# Patient Record
Sex: Male | Born: 1990 | Race: Black or African American | Hispanic: No | Marital: Single | Smoking: Current every day smoker
Health system: Southern US, Community
[De-identification: ages and names within clinical notes are randomized; demographics above are authoritative.]

## PROBLEM LIST (undated history)

## (undated) DIAGNOSIS — I10 Essential (primary) hypertension: Secondary | ICD-10-CM

---

## 2013-09-17 ENCOUNTER — Emergency Department (HOSPITAL_COMMUNITY)
Admission: EM | Admit: 2013-09-17 | Discharge: 2013-09-17 | Disposition: A | Discharge status: Home / Self Care | Payer: Self-pay | Attending: Emergency Medicine | Admitting: Emergency Medicine

## 2013-09-17 ENCOUNTER — Encounter (HOSPITAL_COMMUNITY): Payer: Self-pay | Admitting: Emergency Medicine

## 2013-09-17 DIAGNOSIS — L03119 Cellulitis of unspecified part of limb: Secondary | ICD-10-CM

## 2013-09-17 DIAGNOSIS — B353 Tinea pedis: Secondary | ICD-10-CM | POA: Insufficient documentation

## 2013-09-17 DIAGNOSIS — L02619 Cutaneous abscess of unspecified foot: Secondary | ICD-10-CM | POA: Insufficient documentation

## 2013-09-17 DIAGNOSIS — L03116 Cellulitis of left lower limb: Secondary | ICD-10-CM

## 2013-09-17 DIAGNOSIS — F17200 Nicotine dependence, unspecified, uncomplicated: Secondary | ICD-10-CM | POA: Insufficient documentation

## 2013-09-17 MED ORDER — CEPHALEXIN 500 MG PO CAPS
1000.0000 mg | ORAL_CAPSULE | Freq: Once | ORAL | Status: AC
  Administered 2013-09-17: 1000 mg via ORAL
  Filled 2013-09-17: qty 2

## 2013-09-17 MED ORDER — OXYCODONE-ACETAMINOPHEN 5-325 MG PO TABS: 1.0000 | ORAL_TABLET | ORAL | Status: AC | PRN

## 2013-09-17 MED ORDER — OXYCODONE-ACETAMINOPHEN 5-325 MG PO TABS: 1.0000 | ORAL_TABLET | ORAL | Status: DC | PRN

## 2013-09-17 MED ORDER — OXYCODONE-ACETAMINOPHEN 5-325 MG PO TABS
1.0000 | ORAL_TABLET | Freq: Once | ORAL | Status: AC
  Administered 2013-09-17: 1 via ORAL
  Filled 2013-09-17: qty 1

## 2013-09-17 MED ORDER — CEPHALEXIN 500 MG PO CAPS: 500.0000 mg | ORAL_CAPSULE | Freq: Four times a day (QID) | ORAL | Status: AC

## 2013-09-17 NOTE — ED Notes (Addendum)
Pt c/o having dry feet so he puts baby powder on his feet. Pt states he felt some skin crack in between his little toe and 4th toe. Pt thinks his left foot is swollen.Pt thinks foot is infected.

## 2013-09-17 NOTE — ED Provider Notes (Signed)
CSN: [id number]     Arrival date & time 09/17/13  2139 History  This chart was scribed for Dione Boozeavid Glick, MD, by Yevette EdwardsAngela Bracken, ED Scribe. This patient was seen in room APA03/APA03 and the patient's care was started at 11:06 PM.  irst MD Initiated Contact with Patient 09/17/13 2302     Chief Complaint  Patient presents with  . Foot Pain    The history is provided by the patient. No language interpreter was used.    HPI Comments: Alec Craig is a 23 y.o. male, with a h/o athlete's foot, who presents to the Emergency Department complaining of pain to his left toes. A week and a half ago he noticed the skin to the toes were cracked and bleeding, and he has experienced subsequent pain to his toes. Additionally, he endorses itching and swelling to the toes. The pain is rated as 8/10, and it is increased with ambulation. He has not treated the pain with any medications. He denies a fever. He denies using alcohol. He smokes half of a pack daily. Alec Craig does not have a PCP.   History reviewed. No pertinent past medical history. History reviewed. No pertinent past surgical history. History reviewed. No pertinent family history. History  Substance Use Topics  . Smoking status: Current Every Day Smoker  . Smokeless tobacco: Not on file  . Alcohol Use: Yes    Review of Systems  Constitutional: Negative for fever.  Musculoskeletal: Positive for myalgias.  Skin:       Itching   All other systems reviewed and are negative.   Allergies  Shellfish allergy  Home Medications   Prior to Admission medications   Not on File   Triage Vitals: BP 159/94  Pulse 64  Temp(Src) 98.5 F (36.9 C) (Oral)  Resp 24  Ht 5\' 8"  (1.727 m)  Wt 174 lb 9.6 oz (79.198 kg)  BMI 26.55 kg/m2  SpO2 97%  Physical Exam  Nursing note and vitals reviewed. Constitutional: He is oriented to person, place, and time. He appears well-developed and well-nourished. No distress.  HENT:  Head: Normocephalic and  atraumatic.  Eyes: Conjunctivae and EOM are normal.  Neck: Neck supple. No tracheal deviation present.  Cardiovascular: Normal rate.   Pulmonary/Chest: Effort normal. No respiratory distress.  Musculoskeletal: Normal range of motion.  Mild swelling of left 4th and 5th toes. Cracked skin is noted in the intertriginous areas between 3rd and 4th and 4th and 5th toes consisted with tinea pedis. Mild warmth of the lateral aspect of the left foot. No lymphangitic streaks.   Neurological: He is alert and oriented to person, place, and time.  Skin: Skin is warm and dry.  Psychiatric: He has a normal mood and affect. His behavior is normal.    ED Course  Procedures (including critical care time)  DIAGNOSTIC STUDIES: Oxygen Saturation is 97% on room air, normal by my interpretation.    COORDINATION OF CARE:  11:12 PM- Discussed treatment plan with patient, and the patient agreed to the plan. The plan includes using a topical ointment for athlete's foot, a prescription for Keflex, and pain medication.   MDM   Final diagnoses:  Tinea pedis of right foot  Cellulitis of left foot    Tinea pedis with secondary synovitis. This was explained to the patient and he is advised to treat his tinea pedis with over-the-counter antifungal agents. He is given a prescription for cephalexin to treat his cellulitis and is also given a prescription for oxycodone-acetaminophen  for pain.   I personally performed the services described in this documentation, which was scribed in my presence. The recorded information has been reviewed and is accurate.    Dione Boozeavid Glick, MD 09/19/13 [phone]

## 2013-09-17 NOTE — ED Notes (Signed)
Patient states he noticed a crack between 4th and 5th digit on left foot. Pt sts today he noticed swelling and painful weight bearing. Patients foot is red and warm to touch. Positive pedal pulses

## 2013-09-17 NOTE — Discharge Instructions (Signed)
Apply Lotrimin, Tinactin, or Micatin twice a day. Continue to apply it for a minimum of three weeks, or until all signs of athlete's foot are gone for a full week.   Athlete's Foot Athlete's foot (tinea pedis) is a fungal infection of the skin on the feet. It often occurs on the skin between the toes or underneath the toes. It can also occur on the soles of the feet. Athlete's foot is more likely to occur in hot, humid weather. Not washing your feet or changing your socks often enough can contribute to athlete's foot. The infection can spread from person to person (contagious). CAUSES Athlete's foot is caused by a fungus. This fungus thrives in warm, moist places. Most people get athlete's foot by sharing shower stalls, towels, and wet floors with an infected person. People with weakened immune systems, including those with diabetes, may be more likely to get athlete's foot. SYMPTOMS   Itchy areas between the toes or on the soles of the feet.  White, flaky, or scaly areas between the toes or on the soles of the feet.  Tiny, intensely itchy blisters between the toes or on the soles of the feet.  Tiny cuts on the skin. These cuts can develop a bacterial infection.  Thick or discolored toenails. DIAGNOSIS  Your caregiver can usually tell what the problem is by doing a physical exam. Your caregiver may also take a skin sample from the rash area. The skin sample may be examined under a microscope, or it may be tested to see if fungus will grow in the sample. A sample may also be taken from your toenail for testing. TREATMENT  Over-the-counter and prescription medicines can be used to kill the fungus. These medicines are available as powders or creams. Your caregiver can suggest medicines for you. Fungal infections respond slowly to treatment. You may need to continue using your medicine for several weeks. PREVENTION   Do not share towels.  Wear sandals in wet areas, such as shared locker rooms  and shared showers.  Keep your feet dry. Wear shoes that allow air to circulate. Wear cotton or wool socks. HOME CARE INSTRUCTIONS   Take medicines as directed by your caregiver. Do not use steroid creams on athlete's foot.  Keep your feet clean and cool. Wash your feet daily and dry them thoroughly, especially between your toes.  Change your socks every day. Wear cotton or wool socks. In hot climates, you may need to change your socks 2 to 3 times per day.  Wear sandals or canvas tennis shoes with good air circulation.  If you have blisters, soak your feet in Burow's solution or Epsom salts for 20 to 30 minutes, 2 times a day to dry out the blisters. Make sure you dry your feet thoroughly afterward. SEEK MEDICAL CARE IF:   You have a fever.  You have swelling, soreness, warmth, or redness in your foot.  You are not getting better after 7 days of treatment.  You are not completely cured after 30 days.  You have any problems caused by your medicines. MAKE SURE YOU:   Understand these instructions.  Will watch your condition.  Will get help right away if you are not doing well or get worse. Document Released: 03/10/2000 Document Revised: 06/05/2011 Document Reviewed: 12/30/2010 [facility] Patient Information 2015 [place]. This information is not intended to replace advice given to you by your health care provider. Make sure you discuss any questions you have with your health care  provider.  Cellulitis Cellulitis is an infection of the skin and the tissue beneath it. The infected area is usually red and tender. Cellulitis occurs most often in the arms and lower legs.  CAUSES  Cellulitis is caused by bacteria that enter the skin through cracks or cuts in the skin. The most common types of bacteria that cause cellulitis are Staphylococcus and Streptococcus. SYMPTOMS   Redness and warmth.  Swelling.  Tenderness or pain.  Fever. DIAGNOSIS  Your caregiver can usually  determine what is wrong based on a physical exam. Blood tests may also be done. TREATMENT  Treatment usually involves taking an antibiotic medicine. HOME CARE INSTRUCTIONS   Take your antibiotics as directed. Finish them even if you start to feel better.  Keep the infected arm or leg elevated to reduce swelling.  Apply a warm cloth to the affected area up to 4 times per day to relieve pain.  Only take over-the-counter or prescription medicines for pain, discomfort, or fever as directed by your caregiver.  Keep all follow-up appointments as directed by your caregiver. SEEK MEDICAL CARE IF:   You notice red streaks coming from the infected area.  Your red area gets larger or turns dark in color.  Your bone or joint underneath the infected area becomes painful after the skin has healed.  Your infection returns in the same area or another area.  You notice a swollen bump in the infected area.  You develop new symptoms. SEEK IMMEDIATE MEDICAL CARE IF:   You have a fever.  You feel very sleepy.  You develop vomiting or diarrhea.  You have a general ill feeling (malaise) with muscle aches and pains. MAKE SURE YOU:   Understand these instructions.  Will watch your condition.  Will get help right away if you are not doing well or get worse. Document Released: 12/21/2004 Document Revised: 09/12/2011 Document Reviewed: 05/29/2011 [facility] Patient Information 2015 [place], [place]. This information is not intended to replace advice given to you by your health care provider. Make sure you discuss any questions you have with your health care provider.  Smoking Hazards Smoking cigarettes is extremely bad for your health. Tobacco smoke has over 200 known poisons in it. It contains the poisonous gases nitrogen oxide and carbon monoxide. There are over 60 chemicals in tobacco smoke that cause cancer. Some of the chemicals found in cigarette smoke include:   Cyanide.   Benzene.    Formaldehyde.   Methanol (wood alcohol).   Acetylene (fuel used in welding torches).   Ammonia.  Even smoking lightly shortens your life expectancy by several years. You can greatly reduce the risk of medical problems for you and your family by stopping now. Smoking is the most preventable cause of death and disease in our society. Within days of quitting smoking, your circulation improves, you decrease the risk of having a heart attack, and your lung capacity improves. There may be some increased phlegm in the first few days after quitting, and it may take months for your lungs to clear up completely. Quitting for 10 years reduces your risk of developing lung cancer to almost that of a nonsmoker.  WHAT ARE THE RISKS OF SMOKING? Cigarette smokers have an increased risk of many serious medical problems, including:  Lung cancer.   Lung disease (such as pneumonia, bronchitis, and emphysema).   Heart attack and chest pain due to the heart not getting enough oxygen (angina).   Heart disease and peripheral blood vessel disease.  Hypertension.   Stroke.   Oral cancer (cancer of the lip, mouth, or voice box).   Bladder cancer.   Pancreatic cancer.   Cervical cancer.   Pregnancy complications, including premature birth.   Stillbirths and smaller newborn babies, birth defects, and genetic damage to sperm.   Early menopause.   Lower estrogen level for women.   Infertility.   Facial wrinkles.   Blindness.   Increased risk of broken bones (fractures).   Senile dementia.   Stomach ulcers and internal bleeding.   Delayed wound healing and increased risk of complications during surgery. Because of secondhand smoke exposure, children of smokers have an increased risk of the following:   Sudden infant death syndrome (SIDS).   Respiratory infections.   Lung cancer.   Heart disease.   Ear infections.  WHY IS SMOKING ADDICTIVE? Nicotine is  the chemical agent in tobacco that is capable of causing addiction or dependence. When you smoke and inhale, nicotine is absorbed rapidly into the bloodstream through your lungs. Both inhaled and noninhaled nicotine may be addictive.  WHAT ARE THE BENEFITS OF QUITTING?  There are many health benefits to quitting smoking. Some are:   The likelihood of developing cancer and heart disease decreases. Health improvements are seen almost immediately.   Blood pressure, pulse rate, and breathing patterns start returning to normal soon after quitting.   People who quit may see an improvement in their overall quality of life.  HOW DO YOU QUIT SMOKING? Smoking is an addiction with both physical and psychological effects, and longtime habits can be hard to change. Your health care provider can recommend:  Programs and community resources, which may include group support, education, or therapy.  Replacement products, such as patches, gum, and nasal sprays. Use these products only as directed. Do not replace cigarette smoking with electronic cigarettes (commonly called e-cigarettes). The safety of e-cigarettes is unknown, and some may contain harmful chemicals. FOR MORE INFORMATION  American Lung Association: www.lung.org  American Cancer Society: www.cancer.org Document Released: 04/20/2004 Document Revised: 01/01/2013 Document Reviewed: 09/02/2012 [facility] Patient Information 2015 [place], [place]. This information is not intended to replace advice given to you by your health care provider. Make sure you discuss any questions you have with your health care provider.  Acetaminophen; Oxycodone tablets What is this medicine? ACETAMINOPHEN; OXYCODONE (a set a MEE noe fen; ox i KOE done) is a pain reliever. It is used to treat mild to moderate pain. This medicine may be used for other purposes; ask your health care provider or pharmacist if you have questions. COMMON BRAND NAME(S): Endocet, Magnacet,  Narvox, Percocet, Perloxx, Primalev, Primlev, Roxicet, Xolox What should I tell my health care provider before I take this medicine? They need to know if you have any of these conditions: -brain tumor -Crohn's disease, inflammatory bowel disease, or ulcerative colitis -drug abuse or addiction -head injury -heart or circulation problems -if you often drink alcohol -kidney disease or problems going to the bathroom -liver disease -lung disease, asthma, or breathing problems -an unusual or allergic reaction to acetaminophen, oxycodone, other opioid analgesics, other medicines, foods, dyes, or preservatives -pregnant or trying to get pregnant -breast-feeding How should I use this medicine? Take this medicine by mouth with a full glass of water. Follow the directions on the prescription label. Take your medicine at regular intervals. Do not take your medicine more often than directed. Talk to your pediatrician regarding the use of this medicine in children. Special care may be needed. Patients over  24 years old may have a stronger reaction and need a smaller dose. Overdosage: If you think you have taken too much of this medicine contact a poison control center or emergency room at once. NOTE: This medicine is only for you. Do not share this medicine with others. What if I miss a dose? If you miss a dose, take it as soon as you can. If it is almost time for your next dose, take only that dose. Do not take double or extra doses. What may interact with this medicine? -alcohol -antihistamines -barbiturates like amobarbital, butalbital, butabarbital, methohexital, pentobarbital, phenobarbital, thiopental, and secobarbital -benztropine -drugs for bladder problems like solifenacin, trospium, oxybutynin, tolterodine, hyoscyamine, and methscopolamine -drugs for breathing problems like ipratropium and tiotropium -drugs for certain stomach or intestine problems like propantheline, homatropine  methylbromide, glycopyrrolate, atropine, belladonna, and dicyclomine -general anesthetics like etomidate, ketamine, nitrous oxide, propofol, desflurane, enflurane, halothane, isoflurane, and sevoflurane -medicines for depression, anxiety, or psychotic disturbances -medicines for sleep -muscle relaxants -naltrexone -narcotic medicines (opiates) for pain -phenothiazines like perphenazine, thioridazine, chlorpromazine, mesoridazine, fluphenazine, prochlorperazine, promazine, and trifluoperazine -scopolamine -tramadol -trihexyphenidyl This list may not describe all possible interactions. Give your health care provider a list of all the medicines, herbs, non-prescription drugs, or dietary supplements you use. Also tell them if you smoke, drink alcohol, or use illegal drugs. Some items may interact with your medicine. What should I watch for while using this medicine? Tell your doctor or health care professional if your pain does not go away, if it gets worse, or if you have new or a different type of pain. You may develop tolerance to the medicine. Tolerance means that you will need a higher dose of the medication for pain relief. Tolerance is normal and is expected if you take this medicine for a long time. Do not suddenly stop taking your medicine because you may develop a severe reaction. Your body becomes used to the medicine. This does NOT mean you are addicted. Addiction is a behavior related to getting and using a drug for a non-medical reason. If you have pain, you have a medical reason to take pain medicine. Your doctor will tell you how much medicine to take. If your doctor wants you to stop the medicine, the dose will be slowly lowered over time to avoid any side effects. You may get drowsy or dizzy. Do not drive, use machinery, or do anything that needs mental alertness until you know how this medicine affects you. Do not stand or sit up quickly, especially if you are an older patient. This  reduces the risk of dizzy or fainting spells. Alcohol may interfere with the effect of this medicine. Avoid alcoholic drinks. There are different types of narcotic medicines (opiates) for pain. If you take more than one type at the same time, you may have more side effects. Give your health care provider a list of all medicines you use. Your doctor will tell you how much medicine to take. Do not take more medicine than directed. Call emergency for help if you have problems breathing. The medicine will cause constipation. Try to have a bowel movement at least every 2 to 3 days. If you do not have a bowel movement for 3 days, call your doctor or health care professional. Do not take Tylenol (acetaminophen) or medicines that have acetaminophen with this medicine. Too much acetaminophen can be very dangerous. Many nonprescription medicines contain acetaminophen. Always read the labels carefully to avoid taking more acetaminophen. What side effects  may I notice from receiving this medicine? Side effects that you should report to your doctor or health care professional as soon as possible: -allergic reactions like skin rash, itching or hives, swelling of the face, lips, or tongue -breathing difficulties, wheezing -confusion -light headedness or fainting spells -severe stomach pain -unusually weak or tired -yellowing of the skin or the whites of the eyes Side effects that usually do not require medical attention (report to your doctor or health care professional if they continue or are bothersome): -dizziness -drowsiness -nausea -vomiting This list may not describe all possible side effects. Call your doctor for medical advice about side effects. You may report side effects to FDA at 1-800-FDA-1088. Where should I keep my medicine? Keep out of the reach of children. This medicine can be abused. Keep your medicine in a safe place to protect it from theft. Do not share this medicine with anyone. Selling  or giving away this medicine is dangerous and against the law. Store at room temperature between 20 and 25 degrees C (68 and 77 degrees F). Keep container tightly closed. Protect from light. This medicine may cause accidental overdose and death if it is taken by other adults, children, or pets. Flush any unused medicine down the toilet to reduce the chance of harm. Do not use the medicine after the expiration date. NOTE: This sheet is a summary. It may not cover all possible information. If you have questions about this medicine, talk to your doctor, pharmacist, or health care provider.  2015, [employer] Standard. (2012-11-04 13:17:35)  Cephalexin tablets or capsules What is this medicine? CEPHALEXIN (sef a LEX in) is a cephalosporin antibiotic. It is used to treat certain kinds of bacterial infections It will not work for colds, flu, or other viral infections. This medicine may be used for other purposes; ask your health care provider or pharmacist if you have questions. COMMON BRAND NAME(S): Biocef, Keflex, Keftab What should I tell my health care provider before I take this medicine? They need to know if you have any of these conditions: -kidney disease -stomach or intestine problems, especially colitis -an unusual or allergic reaction to cephalexin, other cephalosporins, penicillins, other antibiotics, medicines, foods, dyes or preservatives -pregnant or trying to get pregnant -breast-feeding How should I use this medicine? Take this medicine by mouth with a full glass of water. Follow the directions on the prescription label. This medicine can be taken with or without food. Take your medicine at regular intervals. Do not take your medicine more often than directed. Take all of your medicine as directed even if you think you are better. Do not skip doses or stop your medicine early. Talk to your pediatrician regarding the use of this medicine in children. While this drug may be prescribed  for selected conditions, precautions do apply. Overdosage: If you think you have taken too much of this medicine contact a poison control center or emergency room at once. NOTE: This medicine is only for you. Do not share this medicine with others. What if I miss a dose? If you miss a dose, take it as soon as you can. If it is almost time for your next dose, take only that dose. Do not take double or extra doses. There should be at least 4 to 6 hours between doses. What may interact with this medicine? -probenecid -some other antibiotics This list may not describe all possible interactions. Give your health care provider a list of all the medicines, herbs, non-prescription drugs, or  dietary supplements you use. Also tell them if you smoke, drink alcohol, or use illegal drugs. Some items may interact with your medicine. What should I watch for while using this medicine? Tell your doctor or health care professional if your symptoms do not begin to improve in a few days. Do not treat diarrhea with over the counter products. Contact your doctor if you have diarrhea that lasts more than 2 days or if it is severe and watery. If you have diabetes, you may get a false-positive result for sugar in your urine. Check with your doctor or health care professional. What side effects may I notice from receiving this medicine? Side effects that you should report to your doctor or health care professional as soon as possible: -allergic reactions like skin rash, itching or hives, swelling of the face, lips, or tongue -breathing problems -pain or trouble passing urine -redness, blistering, peeling or loosening of the skin, including inside the mouth -severe or watery diarrhea -unusually weak or tired -yellowing of the eyes, skin Side effects that usually do not require medical attention (report to your doctor or health care professional if they continue or are bothersome): -gas or heartburn -genital or anal  irritation -headache -joint or muscle pain -nausea, vomiting This list may not describe all possible side effects. Call your doctor for medical advice about side effects. You may report side effects to FDA at 1-800-FDA-1088. Where should I keep my medicine? Keep out of the reach of children. Store at room temperature between 59 and 86 degrees F (15 and 30 degrees C). Throw away any unused medicine after the expiration date. NOTE: This sheet is a summary. It may not cover all possible information. If you have questions about this medicine, talk to your doctor, pharmacist, or health care provider.  2015, [employer]vier/[employer] Standard. (2007-06-17 17:09:13)

## 2013-09-17 NOTE — ED Notes (Signed)
Patient states understanding of instructions, medication therapy, and home treatment. Pt instructed no to drive or operate machinery while taking narcotic medications; patient states understanding. Patient escorted out of ED at this time by family

## 2013-09-19 ENCOUNTER — Emergency Department (HOSPITAL_COMMUNITY): Payer: Self-pay

## 2013-09-19 ENCOUNTER — Encounter (HOSPITAL_COMMUNITY): Payer: Self-pay | Admitting: Emergency Medicine

## 2013-09-19 ENCOUNTER — Emergency Department (HOSPITAL_COMMUNITY)
Admission: EM | Admit: 2013-09-19 | Discharge: 2013-09-19 | Disposition: A | Discharge status: Home / Self Care | Payer: Self-pay | Attending: Emergency Medicine | Admitting: Emergency Medicine

## 2013-09-19 DIAGNOSIS — L02619 Cutaneous abscess of unspecified foot: Secondary | ICD-10-CM | POA: Insufficient documentation

## 2013-09-19 DIAGNOSIS — Z792 Long term (current) use of antibiotics: Secondary | ICD-10-CM | POA: Insufficient documentation

## 2013-09-19 DIAGNOSIS — L03119 Cellulitis of unspecified part of limb: Principal | ICD-10-CM

## 2013-09-19 DIAGNOSIS — L03116 Cellulitis of left lower limb: Secondary | ICD-10-CM

## 2013-09-19 DIAGNOSIS — F17200 Nicotine dependence, unspecified, uncomplicated: Secondary | ICD-10-CM | POA: Insufficient documentation

## 2013-09-19 MED ORDER — OXYCODONE HCL 5 MG PO TABS: 5.0000 mg | ORAL_TABLET | ORAL | Status: AC | PRN

## 2013-09-19 MED ORDER — SULFAMETHOXAZOLE-TMP DS 800-160 MG PO TABS
1.0000 | ORAL_TABLET | Freq: Once | ORAL | Status: AC
  Administered 2013-09-19: 1 via ORAL
  Filled 2013-09-19: qty 1

## 2013-09-19 MED ORDER — SULFAMETHOXAZOLE-TRIMETHOPRIM 800-160 MG PO TABS: 1.0000 | ORAL_TABLET | Freq: Two times a day (BID) | ORAL | Status: AC

## 2013-09-19 MED ORDER — OXYCODONE-ACETAMINOPHEN 5-325 MG PO TABS
2.0000 | ORAL_TABLET | Freq: Once | ORAL | Status: AC
  Administered 2013-09-19: 2 via ORAL
  Filled 2013-09-19: qty 2

## 2013-09-19 NOTE — ED Notes (Signed)
Pain , redness, swelling of lt foot, Seen here on 6/24, says is getting worse.

## 2013-09-19 NOTE — Discharge Instructions (Signed)
Cellulitis Cellulitis is an infection of the skin and the tissue beneath it. The infected area is usually red and tender. Cellulitis occurs most often in the arms and lower legs.  CAUSES  Cellulitis is caused by bacteria that enter the skin through cracks or cuts in the skin. The most common types of bacteria that cause cellulitis are Staphylococcus and Streptococcus. SYMPTOMS   Redness and warmth.  Swelling.  Tenderness or pain.  Fever. DIAGNOSIS  Your caregiver can usually determine what is wrong based on a physical exam. Blood tests may also be done. TREATMENT  Treatment usually involves taking an antibiotic medicine. HOME CARE INSTRUCTIONS   Take your antibiotics as directed. Finish them even if you start to feel better.  Keep the infected arm or leg elevated to reduce swelling.  Apply a warm cloth to the affected area up to 4 times per day to relieve pain.  Only take over-the-counter or prescription medicines for pain, discomfort, or fever as directed by your caregiver.  Keep all follow-up appointments as directed by your caregiver. SEEK MEDICAL CARE IF:   You notice red streaks coming from the infected area.  Your red area gets larger or turns dark in color.  Your bone or joint underneath the infected area becomes painful after the skin has healed.  Your infection returns in the same area or another area.  You notice a swollen bump in the infected area.  You develop new symptoms. SEEK IMMEDIATE MEDICAL CARE IF:   You have a fever.  You feel very sleepy.  You develop vomiting or diarrhea.  You have a general ill feeling (malaise) with muscle aches and pains. MAKE SURE YOU:   Understand these instructions.  Will watch your condition.  Will get help right away if you are not doing well or get worse. Document Released: 12/21/2004 Document Revised: 09/12/2011 Document Reviewed: 05/29/2011 [employer] Patient Information 2015 [employer]. This information is  not intended to replace advice given to you by your health care provider. Make sure you discuss any questions you have with your health care provider.  

## 2013-09-19 NOTE — ED Provider Notes (Signed)
CSN: [id number]     Arrival date & time 09/19/13  2107 History   First MD Initiated Contact with Patient 09/19/13 2128    This chart was scribed for Lyanne CoKevin M Zeno Hickel, MD by Marica OtterNusrat Rahman, ED Scribe. This patient was seen in room APA12/APA12 and the patient's care was started at 9:54 PM.  Chief Complaint  Patient presents with  . Cellulitis   HPI HPI Comments: Alec Craig is a 23 y.o. male who presents to the Emergency Department complaining of pain with associated swelling and redness of the left foot. Pt denies any specific injury to the area, however, notes that approximately 1.5 weeks ago he noticed cracks and bleeding to the left toes. Pt rates his current pain a 9 out of 10. Pt also reports he presented and was treated at the ED with antibiotics 2 days ago, for the same. Pt reports his worsening Sx since his last ED visit. Pt is a current everyday smoker.   History reviewed. No pertinent past medical history. History reviewed. No pertinent past surgical history. History reviewed. No pertinent family history. History  Substance Use Topics  . Smoking status: Current Every Day Smoker  . Smokeless tobacco: Not on file  . Alcohol Use: Yes    Review of Systems  A complete 10 system review of systems was obtained and all systems are negative except as noted in the HPI and PMH.    Allergies  Shellfish allergy  Home Medications   Prior to Admission medications   Medication Sig Start Date End Date Taking? Authorizing Provider  cephALEXin (KEFLEX) 500 MG capsule Take 1 capsule (500 mg total) by mouth 4 (four) times daily. 09/17/13  Yes Dione Boozeavid Glick, MD  oxyCODONE-acetaminophen (PERCOCET) 5-325 MG per tablet Take 1 tablet by mouth every 4 (four) hours as needed for moderate pain. 09/17/13  Yes Dione Boozeavid Glick, MD  oxyCODONE (ROXICODONE) 5 MG immediate release tablet Take 1 tablet (5 mg total) by mouth every 4 (four) hours as needed for severe pain. 09/19/13   Lyanne CoKevin M Brandley Aldrete, MD   sulfamethoxazole-trimethoprim (SEPTRA DS) 800-160 MG per tablet Take 1 tablet by mouth 2 (two) times daily. 09/19/13   Lyanne CoKevin M Denaisha Swango, MD   Triage Vitals: BP 161/96  Pulse 103  Temp(Src) 97.8 F (36.6 C) (Oral)  Resp 18  Ht 5\' 8"  (1.727 m)  Wt 174 lb (78.926 kg)  BMI 26.46 kg/m2  SpO2 96% Physical Exam  Nursing note and vitals reviewed. Constitutional: He is oriented to person, place, and time. He appears well-developed and well-nourished.  HENT:  Head: Normocephalic.  Eyes: EOM are normal.  Neck: Normal range of motion.  Pulmonary/Chest: Effort normal.  Abdominal: He exhibits no distension.  Musculoskeletal: Normal range of motion.  Dorsal of left foot with erythema and swelling to the level of the left mid foot. Warmth and tenderness without obvious fluctuance. Majority of tenderness localized to the distal 5th and 4th metatarsals. No drainage. Nl PT and DP pulses.   Neurological: He is alert and oriented to person, place, and time.  Psychiatric: He has a normal mood and affect.    ED Course  Procedures (including critical care time) DIAGNOSTIC STUDIES: Oxygen Saturation is 96% on RA, adequate by my interpretation.    COORDINATION OF CARE: 9:57 PM-Discussed treatment plan which includes meds and imaging with pt at bedside. Patient verbalizes understanding and agrees with treatment plan.   Labs Review Labs Reviewed - No data to display  Imaging Review Dg Foot Complete Left  09/19/2013   CLINICAL DATA:  Pain and redness across dorsal metatarsals for 1 week. No known injury. Question cellulitis.  EXAM: LEFT FOOT - COMPLETE 3+ VIEW  COMPARISON:  None.  FINDINGS: Mild dorsal forefoot soft tissue thickening. No subcutaneous gas, radiopaque foreign body, or evidence of osseous infection. No fracture or joint malalignment. Incidental sclerosis in the distal tibial diaphysis which could reflect healed nonossifying fibroma or bone island.  IMPRESSION: No acute osseous findings or  radiopaque foreign body.   Electronically Signed   By: Tiburcio PeaJonathan  Watts M.D.   On: 09/19/2013 22:30  I personally reviewed the imaging tests through PACS system I reviewed available ER/hospitalization records through the EMR    EKG Interpretation None      MDM   Final diagnoses:  Cellulitis of left foot    Patient appears to have worsening cellulitis of the left foot.  Bactrim will be added to the Keflex.  Initial plain film demonstrates no obvious gas or plain film evidence of osteo-.  I've ordered an MRI [facility] of [facility] left [facility] for possible developing deep space infection.  I do not think the patient needs admission to the hospital overnight and IV antibiotics.  The MRI scan will further define if there is underlying fluid collection/osteomyelitis  I personally performed the services described in this documentation, which was scribed in my presence. The recorded information has been reviewed and is accurate.     Lyanne CoKevin M Kirsty Monjaraz, MD 09/19/13 [phone]

## 2013-09-20 ENCOUNTER — Ambulatory Visit (HOSPITAL_COMMUNITY)
Admit: 2013-09-20 | Discharge: 2013-09-20 | Disposition: A | Discharge status: Home / Self Care | Payer: Self-pay | Source: Ambulatory Visit | Attending: Emergency Medicine | Admitting: Emergency Medicine

## 2013-09-20 DIAGNOSIS — M79609 Pain in unspecified limb: Secondary | ICD-10-CM | POA: Insufficient documentation

## 2013-09-20 DIAGNOSIS — M7989 Other specified soft tissue disorders: Secondary | ICD-10-CM | POA: Insufficient documentation

## 2013-09-20 NOTE — ED Provider Notes (Signed)
Received call from mri that patient had mr of foot completed.  Reviewed note and mri.  No evidence of osteomyelitis or abscess on mri.  Patient may be discharged to follow up as scheduled.  Advise to return to any ed if worsening.  Take all antibiotics as prescribed.   Hilario Quarryanielle S Johnnae Impastato, MD 09/20/13 [phone]

## 2013-09-24 ENCOUNTER — Emergency Department (HOSPITAL_COMMUNITY)
Admission: EM | Admit: 2013-09-24 | Discharge: 2013-09-24 | Disposition: A | Discharge status: Home / Self Care | Payer: Self-pay | Attending: Emergency Medicine | Admitting: Emergency Medicine

## 2013-09-24 ENCOUNTER — Encounter (HOSPITAL_COMMUNITY): Payer: Self-pay | Admitting: Emergency Medicine

## 2013-09-24 ENCOUNTER — Telehealth: Payer: Self-pay | Admitting: Orthopedic Surgery

## 2013-09-24 DIAGNOSIS — F17200 Nicotine dependence, unspecified, uncomplicated: Secondary | ICD-10-CM | POA: Insufficient documentation

## 2013-09-24 DIAGNOSIS — L02612 Cutaneous abscess of left foot: Secondary | ICD-10-CM

## 2013-09-24 DIAGNOSIS — Z792 Long term (current) use of antibiotics: Secondary | ICD-10-CM | POA: Insufficient documentation

## 2013-09-24 DIAGNOSIS — L03119 Cellulitis of unspecified part of limb: Secondary | ICD-10-CM

## 2013-09-24 DIAGNOSIS — L02619 Cutaneous abscess of unspecified foot: Secondary | ICD-10-CM | POA: Insufficient documentation

## 2013-09-24 DIAGNOSIS — Z79899 Other long term (current) drug therapy: Secondary | ICD-10-CM | POA: Insufficient documentation

## 2013-09-24 LAB — CBC WITH DIFFERENTIAL/PLATELET
BASOS PCT: 1 % (ref 0–1)
Basophils Absolute: 0.1 10*3/uL (ref 0.0–0.1)
Eosinophils Absolute: 0.3 10*3/uL (ref 0.0–0.7)
Eosinophils Relative: 4 % (ref 0–5)
HCT: 42.5 % (ref 39.0–52.0)
Hemoglobin: 15 g/dL (ref 13.0–17.0)
Lymphocytes Relative: 31 % (ref 12–46)
Lymphs Abs: 2.2 10*3/uL (ref 0.7–4.0)
MCH: 30.7 pg (ref 26.0–34.0)
MCHC: 35.3 g/dL (ref 30.0–36.0)
MCV: 87.1 fL (ref 78.0–100.0)
MONO ABS: 0.4 10*3/uL (ref 0.1–1.0)
Monocytes Relative: 6 % (ref 3–12)
Neutro Abs: 4.2 10*3/uL (ref 1.7–7.7)
Neutrophils Relative %: 58 % (ref 43–77)
Platelets: 290 10*3/uL (ref 150–400)
RBC: 4.88 MIL/uL (ref 4.22–5.81)
RDW: 12 % (ref 11.5–15.5)
WBC: 7.2 10*3/uL (ref 4.0–10.5)

## 2013-09-24 LAB — BASIC METABOLIC PANEL
BUN: 11 mg/dL (ref 6–23)
CHLORIDE: 97 meq/L (ref 96–112)
CO2: 28 meq/L (ref 19–32)
CREATININE: 1.1 mg/dL (ref 0.50–1.35)
Calcium: 9.6 mg/dL (ref 8.4–10.5)
GFR calc Af Amer: >90 mL/min (ref >90)
GFR calc non Af Amer: >90 mL/min (ref >90)
Glucose, Bld: 86 mg/dL (ref 70–99)
POTASSIUM: 4.1 meq/L (ref 3.7–5.3)
Sodium: 139 mEq/L (ref 137–147)

## 2013-09-24 LAB — SEDIMENTATION RATE: SED RATE: 12 mm/h (ref 0–16)

## 2013-09-24 MED ORDER — POVIDONE-IODINE 10 % EX SOLN
CUTANEOUS | Status: AC
  Administered 2013-09-24: 03:00:00 via TOPICAL
  Filled 2013-09-24: qty 118

## 2013-09-24 MED ORDER — ONDANSETRON HCL 4 MG/2ML IJ SOLN
4.0000 mg | Freq: Once | INTRAMUSCULAR | Status: AC
  Administered 2013-09-24: 4 mg via INTRAVENOUS
  Filled 2013-09-24: qty 2

## 2013-09-24 MED ORDER — SODIUM CHLORIDE 0.9 % IV SOLN
Freq: Once | INTRAVENOUS | Status: AC
  Administered 2013-09-24: 1000 mL via INTRAVENOUS

## 2013-09-24 MED ORDER — CLINDAMYCIN PHOSPHATE 600 MG/50ML IV SOLN
600.0000 mg | Freq: Once | INTRAVENOUS | Status: AC
  Administered 2013-09-24: 600 mg via INTRAVENOUS
  Filled 2013-09-24: qty 50

## 2013-09-24 MED ORDER — HYDROMORPHONE HCL PF 1 MG/ML IJ SOLN
1.0000 mg | Freq: Once | INTRAMUSCULAR | Status: AC
Start: 2013-09-24 — End: 2013-09-24
  Administered 2013-09-24: 1 mg via INTRAVENOUS
  Filled 2013-09-24: qty 1

## 2013-09-24 MED ORDER — CLINDAMYCIN HCL 300 MG PO CAPS: 300.0000 mg | ORAL_CAPSULE | Freq: Three times a day (TID) | ORAL | Status: DC

## 2013-09-24 MED ORDER — HYDROCODONE-ACETAMINOPHEN 5-325 MG PO TABS: 1.0000 | ORAL_TABLET | ORAL | Status: AC | PRN

## 2013-09-24 MED ORDER — LIDOCAINE HCL (PF) 1 % IJ SOLN
INTRAMUSCULAR | Status: AC
  Administered 2013-09-24: 03:00:00
  Filled 2013-09-24: qty 5

## 2013-09-24 MED ORDER — MORPHINE SULFATE 4 MG/ML IJ SOLN
6.0000 mg | Freq: Once | INTRAMUSCULAR | Status: AC
  Administered 2013-09-24: 6 mg via INTRAVENOUS
  Filled 2013-09-24: qty 2

## 2013-09-24 MED FILL — Oxycodone w/ Acetaminophen Tab 5-325 MG: ORAL | Qty: 6 | Status: AC

## 2013-09-24 NOTE — Telephone Encounter (Signed)
Patient has just called our office to request appointment following visit and testing (Xray and MRI), at [facility] for problem of cellulitis of foot.  I offered appointment, including times for tomorrow, and then patient relayed that he does not have his insurance straight right now, due to new job; I relayed what our typical initial visit self-pay policy is, and patient stated that he was told by Emergency Room that he "would not be hassled about no money at the first visit."  Please advise regarding scheduling.  Patient's ph# [phone].

## 2013-09-24 NOTE — ED Notes (Signed)
Patient returns to ED for increased swelling to left foot; states on antibiotics for cellulitis.

## 2013-09-24 NOTE — Telephone Encounter (Signed)
I called back to patient to further discuss appointment, and schedule - I had also left a message with Aurea GraffJoan, clinic site manager, and she confirmed that this is office policy, and that there are [facility] regarding no-interest payment plans.  I reached patient's voice mail - I left a message to return call about the appointment.

## 2013-09-24 NOTE — ED Provider Notes (Signed)
CSN: [id number]     Arrival date & time 09/24/13  0020 History  This chart was scribed for Enid SkeensJoshua M Jaina Morin, MD by Charline BillsEssence Howell, ED Scribe. The patient was seen in room APA09/APA09. Patient's care was started at 12:39 AM.   Chief Complaint  Patient presents with  . Cellulitis    The history is provided by the patient. No language interpreter was used.   HPI Comments: Alec Craig is a 23 y.o. male who presents to the Emergency Department complaining of L foot swelling onset 1 week ago. Pt states tha he noted pain upon waking. Pt states that he woke up this morning and noted gradually worsening swelling, redness and pain to the L foot. He denies fever, chills, nausea, vomiting, back pain, abdominal pain. Pt has had a MRI done that did not show any deep infection. Pt states that he has been taking his antibiotics for 6 days as prescribes with no relief. Pt has not followed-up with an orthopedist.  History reviewed. No pertinent past medical history. History reviewed. No pertinent past surgical history. No family history on file. History  Substance Use Topics  . Smoking status: Current Every Day Smoker  . Smokeless tobacco: Not on file  . Alcohol Use: Yes    Review of Systems  All other systems reviewed and are negative.  Allergies  Shellfish allergy  Home Medications   Prior to Admission medications   Medication Sig Start Date End Date Taking? Authorizing Provider  cephALEXin (KEFLEX) 500 MG capsule Take 1 capsule (500 mg total) by mouth 4 (four) times daily. 09/17/13  Yes Dione Boozeavid Glick, MD  oxyCODONE (ROXICODONE) 5 MG immediate release tablet Take 1 tablet (5 mg total) by mouth every 4 (four) hours as needed for severe pain. 09/19/13  Yes Lyanne CoKevin M Campos, MD  sulfamethoxazole-trimethoprim (SEPTRA DS) 800-160 MG per tablet Take 1 tablet by mouth 2 (two) times daily. 09/19/13  Yes Lyanne CoKevin M Campos, MD  oxyCODONE-acetaminophen (PERCOCET) 5-325 MG per tablet Take 1 tablet by mouth every 4 (four)  hours as needed for moderate pain. 09/17/13   Dione Boozeavid Glick, MD   Triage Vitals: BP 142/75  Pulse 71  Temp(Src) 97.7 F (36.5 C) (Oral)  Resp 18  Ht 5\' 8"  (1.727 m)  Wt 170 lb (77.111 kg)  BMI 25.85 kg/m2  SpO2 97% Physical Exam  Nursing note and vitals reviewed. Constitutional: He is oriented to person, place, and time. He appears well-developed and well-nourished.  HENT:  Head: Normocephalic and atraumatic.  Eyes: Conjunctivae and EOM are normal.  Neck: Neck supple.  Cardiovascular: Normal rate.   Pulmonary/Chest: Effort normal.  Musculoskeletal: Normal range of motion.       Left foot: He exhibits tenderness and swelling.  No pain on medial aspect of foot or ankle Erythema and warmth to lateral and dorsal aspect of foot and below ankle Mild fluctuance to lateral distal aspect between lateral 2 toes Pt can move toes however there is pain with moving  Neurological: He is alert and oriented to person, place, and time.  Skin: Skin is warm and dry.  Psychiatric: He has a normal mood and affect. His behavior is normal.   ED Course  Procedures (including critical care time) EMERGENCY DEPARTMENT US SOFT TISSUE INTERPRETATION "Study: Limited Soft Tissue Ultrasound"  INDICATIONS: Pain and Soft tissue infection Multiple views of the body part were obtained in real-time with a multi-frequency linear probe PERFORMED BY:  Myself IMAGES ARCHIVED?: Yes SIDE:Left BODY PART:Lower extremity FINDINGS: Abcess present  and Cellulitis present INTERPRETATION:  Abcess present and Cellulitis present   INCISION AND DRAINAGE Performed by: Enid SkeensZAVITZ, Deisha Stull M Consent: Verbal consent obtained. Risks and benefits: risks, benefits and alternatives were discussed Type: abscess  Body area: left dorsal foot Anesthesia: local infiltration Incision was made with a scalpel. Local anesthetic: lidocaine Anesthetic total: 5 ml Complexity: complex Blunt dissection to break up loculations Drainage: blood  and pus  Patient tolerance: Patient tolerated the procedure well with no immediate complications.    DIAGNOSTIC STUDIES: Oxygen Saturation is 97% on RA, normal by my interpretation.    COORDINATION OF CARE: 12:44 AM-Discussed treatment plan with pt at bedside and pt agreed to plan.   Labs Review Labs Reviewed - No data to display  Imaging Review No results found.   EKG Interpretation None      MDM   Final diagnoses:  Foot abscess, left  Cellulitis of foot   Healthy patient with persistent cellulitis and no improvement. Patient denies systemic symptoms and blood work unremarkable. Patient has developed an abscess that is likely the reason why his infection has not improved. Patient had recent MRI without sign of bone infection. Discussed risks and benefits of I and D. and patient agrees with plan. Small incision and drainage done with blood and pus. IV antibiotics given in close followup with orthopedics discussed in the next 24 hours. Discussed observation in the hospital versus seeing the orthopedic doctor in the next 24 hours and patient prefers outpatient followup. I discussed the critical importance of close followup especially with foot infection.  Patient has no fever in ER and blood work unremarkable. Sedimentation rate pending to assist with or for followup. [facility] discussed close outpatient followup. Discussed observation in the hospital versus close outpatient followup and patient prefers to see orthopedics in the next 48 hours. Patient soaked his foot in the ER feels improved on recheck. IV dose of clindamycin given oral clinic for home. Results and differential diagnosis were discussed with the patient/parent/guardian. Close follow up outpatient was discussed, comfortable with the plan.   Medications  lidocaine (PF) (XYLOCAINE) 1 % injection (not administered)  povidone-iodine (BETADINE) 10 % external solution (not administered)  morphine 4 MG/ML  injection 6 mg (6 mg Intravenous Given 09/24/13 0116)  clindamycin (CLEOCIN) IVPB 600 mg (0 mg Intravenous Stopped 09/24/13 0218)  ondansetron (ZOFRAN) injection 4 mg (4 mg Intravenous Given 09/24/13 0146)  HYDROmorphone (DILAUDID) injection 1 mg (1 mg Intravenous Given 09/24/13 0145)  0.9 %  sodium chloride infusion (1,000 mLs Intravenous New Bag/Given 09/24/13 0147)    Filed Vitals:   09/24/13 0028  BP: 142/75  Pulse: 71  Temp: 97.7 F (36.5 C)  TempSrc: Oral  Resp: 18  Height: 5\' 8"  (1.727 m)  Weight: 170 lb (77.111 kg)  SpO2: 97%      I personally performed the services described in this documentation, which was scribed in my presence. The recorded information has been reviewed and is accurate.    Enid SkeensJoshua M Cosby Proby, MD 09/24/13 [phone]

## 2013-09-24 NOTE — Telephone Encounter (Signed)
IF HE CAN NOT MAKE ARRANGEMENTS THEN HE WILL HAVE TO BE FOLLOWED BY THE ER

## 2013-09-24 NOTE — Discharge Instructions (Signed)
See orthopaedics for recheck in 24-48 hrs. Return for fevers, spreading redness or worsening pain. If you were given medicines take as directed.  If you are on coumadin or contraceptives realize their levels and effectiveness is altered by many different medicines.  If you have any reaction (rash, tongues swelling, other) to the medicines stop taking and see a physician.   Please follow up as directed and return to the ER or see a physician for new or worsening symptoms.  Thank you. Filed Vitals:   09/24/13 0028  BP: 142/75  Pulse: 71  Temp: 97.7 F (36.5 C)  TempSrc: Oral  Resp: 18  Height: 5\' 8"  (1.727 m)  Weight: 170 lb (77.111 kg)  SpO2: 97%    Abscess An abscess (boil or furuncle) is an infected area on or under the skin. This area is filled with yellowish-white fluid (pus) and other material (debris). HOME CARE   Only take medicines as told by your doctor.  If you were given antibiotic medicine, take it as directed. Finish the medicine even if you start to feel better.  If gauze is used, follow your doctor's directions for changing the gauze.  To avoid spreading the infection:  Keep your abscess covered with a bandage.  Wash your hands well.  Do not share personal care items, towels, or whirlpools with others.  Avoid skin contact with others.  Keep your skin and clothes clean around the abscess.  Keep all doctor visits as told. GET HELP RIGHT AWAY IF:   You have more pain, puffiness (swelling), or redness in the wound site.  You have more fluid or blood coming from the wound site.  You have muscle aches, chills, or you feel sick.  You have a fever. MAKE SURE YOU:   Understand these instructions.  Will watch your condition.  Will get help right away if you are not doing well or get worse. Document Released: 08/30/2007 Document Revised: 09/12/2011 Document Reviewed: 05/26/2011 [facility] Patient Information 2015 [place] [place]. This information is not  intended to replace advice given to you by your health care provider. Make sure you discuss any questions you have with your health care provider.

## 2013-09-25 ENCOUNTER — Encounter: Payer: Self-pay | Admitting: Orthopedic Surgery

## 2013-09-25 ENCOUNTER — Ambulatory Visit (INDEPENDENT_AMBULATORY_CARE_PROVIDER_SITE_OTHER): Payer: Self-pay | Admitting: Orthopedic Surgery

## 2013-09-25 DIAGNOSIS — L02619 Cutaneous abscess of unspecified foot: Secondary | ICD-10-CM

## 2013-09-25 DIAGNOSIS — L03119 Cellulitis of unspecified part of limb: Secondary | ICD-10-CM

## 2013-09-25 DIAGNOSIS — L02612 Cutaneous abscess of left foot: Secondary | ICD-10-CM

## 2013-09-25 NOTE — Patient Instructions (Signed)
Epsom salt soak 3 x a day for 15 min   Continue clindamycin   Return 1 weeks   OOW x 1 week

## 2013-09-25 NOTE — Progress Notes (Signed)
Patient ID: Alec Craig, male   DOB: [birth year], 23 y.o.   MRN: [id number]  Chief Complaint  Patient presents with  . Foot Problem    Foot infection left foot    There were no vitals taken for this visit.  23 year old male had an incision and drainage of a left foot abscess which was located between knee dorsum of the left fourth and fifth digits of his foot. He was previously treated with Keflex and Bactrim without improvement and subsequently had MRI which showed no abscess  He listed his review of systems is negative  No past medical history on file.  No past surgical history on file.    General the patient is well-developed and well-nourished grooming and hygiene are normal Oriented x3 Mood and affect normal Ambulation crutches foot shows a small 3 mm incision on the dorsum of the web space between the fourth digit of left foot cellulitis and redness and tenderness throughout the dorsum of the foot without fluctuance ankle joint and toe joints are stable muscle tone normal pulses are good   Cardiovascular exam is normal Sensory exam normal Noticeable difference between the dorsum of the opposite right foot. Skin clean dry and intact no rash  Impression  Encounter Diagnosis  Name Primary?  Marland Kitchen. Abscess of left foot Yes    Recommend soaks 3 times a day with Epsom salts continue clindamycin follow up in a week continue out of work one week

## 2013-09-25 NOTE — Telephone Encounter (Signed)
Patient making arrangements, and scheduled.

## 2013-10-02 ENCOUNTER — Ambulatory Visit (INDEPENDENT_AMBULATORY_CARE_PROVIDER_SITE_OTHER): Payer: Self-pay | Admitting: Orthopedic Surgery

## 2013-10-02 ENCOUNTER — Encounter: Payer: Self-pay | Admitting: Orthopedic Surgery

## 2013-10-02 VITALS — BP 118/77 | Ht 68.0 in | Wt 170.0 lb

## 2013-10-02 DIAGNOSIS — L02612 Cutaneous abscess of left foot: Secondary | ICD-10-CM

## 2013-10-02 DIAGNOSIS — L03119 Cellulitis of unspecified part of limb: Secondary | ICD-10-CM

## 2013-10-02 DIAGNOSIS — L02619 Cutaneous abscess of unspecified foot: Secondary | ICD-10-CM

## 2013-10-02 MED ORDER — CLINDAMYCIN HCL 300 MG PO CAPS: 300.0000 mg | ORAL_CAPSULE | Freq: Three times a day (TID) | ORAL | Status: AC

## 2013-10-02 MED ORDER — CLINDAMYCIN HCL 300 MG PO CAPS: 300.0000 mg | ORAL_CAPSULE | Freq: Three times a day (TID) | ORAL | Status: DC

## 2013-10-02 NOTE — Progress Notes (Signed)
Patient ID: Alec Craig, male   DOB: 09/22/1990, 23 y.o.   MRN: [id number]  Chief Complaint  Patient presents with  . Follow-up    1 week follow up left foot infection    Recheck left foot infection had a incision and drainage in the emergency room after failed Keflex and Bactrim. Currently on clindamycin on the night of 10 days.  Ros denies fever chills   BP 118/77  Ht 5\' 8"  (1.727 m)  Wt 170 lb (77.111 kg)  BMI 25.85 kg/m2  General appearance is normal, the patient is alert and oriented x3 with normal mood and affect.   The foot has improved he has a little bit of swelling just at the site of the abscess. The swelling has gone down the tenderness has improved as well  He can return to work as long as he takes the remaining 10 days of antibiotics which were prescribed today. No followup needed unless there is a problem

## 2013-10-02 NOTE — Patient Instructions (Signed)
Cleared to return to work Complete another course of antiobiotics

## 2015-05-27 ENCOUNTER — Emergency Department (HOSPITAL_COMMUNITY): Admission: EM | Admit: 2015-05-27 | Discharge: 2015-05-27 | Discharge status: Absent without leave | Payer: Self-pay

## 2019-10-26 ENCOUNTER — Emergency Department (HOSPITAL_COMMUNITY): Payer: PRIVATE HEALTH INSURANCE

## 2019-10-26 ENCOUNTER — Emergency Department (HOSPITAL_COMMUNITY)
Admission: EM | Admit: 2019-10-26 | Discharge: 2019-10-26 | Disposition: A | Discharge status: Home / Self Care | Payer: PRIVATE HEALTH INSURANCE | Attending: Emergency Medicine | Admitting: Emergency Medicine

## 2019-10-26 ENCOUNTER — Other Ambulatory Visit: Payer: Self-pay

## 2019-10-26 ENCOUNTER — Encounter (HOSPITAL_COMMUNITY): Payer: Self-pay | Admitting: Obstetrics and Gynecology

## 2019-10-26 DIAGNOSIS — R0789 Other chest pain: Secondary | ICD-10-CM | POA: Insufficient documentation

## 2019-10-26 DIAGNOSIS — F17200 Nicotine dependence, unspecified, uncomplicated: Secondary | ICD-10-CM | POA: Diagnosis not present

## 2019-10-26 DIAGNOSIS — I10 Essential (primary) hypertension: Secondary | ICD-10-CM | POA: Diagnosis not present

## 2019-10-26 DIAGNOSIS — R079 Chest pain, unspecified: Secondary | ICD-10-CM | POA: Diagnosis present

## 2019-10-26 HISTORY — DX: Essential (primary) hypertension: I10

## 2019-10-26 LAB — BASIC METABOLIC PANEL
Anion gap: 11 (ref 5–15)
BUN: 11 mg/dL (ref 6–20)
CO2: 24 mmol/L (ref 22–32)
Calcium: 9.6 mg/dL (ref 8.9–10.3)
Chloride: 102 mmol/L (ref 98–111)
Creatinine, Ser: 1.02 mg/dL (ref 0.61–1.24)
GFR calc Af Amer: >60 mL/min (ref >60)
GFR calc non Af Amer: >60 mL/min (ref >60)
Glucose, Bld: 94 mg/dL (ref 70–99)
Potassium: 4 mmol/L (ref 3.5–5.1)
Sodium: 137 mmol/L (ref 135–145)

## 2019-10-26 LAB — CBC
HCT: 50 % (ref 39.0–52.0)
Hemoglobin: 16.9 g/dL (ref 13.0–17.0)
MCH: 29.5 pg (ref 26.0–34.0)
MCHC: 33.8 g/dL (ref 30.0–36.0)
MCV: 87.4 fL (ref 80.0–100.0)
Platelets: 323 10*3/uL (ref 150–400)
RBC: 5.72 MIL/uL (ref 4.22–5.81)
RDW: 12.1 % (ref 11.5–15.5)
WBC: 7.4 10*3/uL (ref 4.0–10.5)
nRBC: 0 % (ref 0.0–0.2)

## 2019-10-26 LAB — TROPONIN I (HIGH SENSITIVITY): Troponin I (High Sensitivity): 3 ng/L (ref <18)

## 2019-10-26 MED ORDER — SUCRALFATE 1 G PO TABS
1.0000 g | ORAL_TABLET | Freq: Three times a day (TID) | ORAL | 0 refills | Status: AC | PRN
Start: 2019-10-26 — End: ?

## 2019-10-26 MED ORDER — SODIUM CHLORIDE 0.9% FLUSH: 3.0000 mL | Freq: Once | INTRAVENOUS | Status: DC

## 2019-10-26 NOTE — ED Triage Notes (Signed)
Patient reports chest pain that started in the center of his chest and moved to the left rib cage area and down the left arm and up the jaw. Patient reports pressure and discomfort as well as fatigue

## 2019-10-26 NOTE — Discharge Instructions (Signed)
I have prescribed you Carafate which is an abortive agent for symptoms of reflux disease.  Please take 1 tablet the next time you experience symptoms of chest discomfort.  If it relieves her symptoms, this is likely diagnostic of GERD.  I would also like for you to follow-up with your primary care provider regarding today's encounter.  As of now, this is undifferentiated chest pain.  However, your physical exam and laboratory work-up was entirely reassuring.  Additionally, your imaging did not demonstrate any evidence of cardiopulmonary disease.  Return to the ER seek immediate medical attention should you experience any new or worsening symptoms.

## 2019-10-26 NOTE — ED Notes (Signed)
His skin is normal, warm and dry and he is breathing normally. He smilingly tells me he feels much better.

## 2019-10-26 NOTE — ED Provider Notes (Signed)
[facility]-EMERGENCY DEPT Provider Note   CSN: [id number] Arrival date & time: 10/26/19  0857     History Chief Complaint  Patient presents with  . Chest Pain    Alec Craig. is a 29 y.o. male with PMH of HTN presents the ED with complaints of chest pain.  Patient states that he has had several episodes of central chest discomfort with no obvious pattern.  He describes his chest pain as a "pressure" that can last anywhere from 30 to 90 minutes.  He reports that his first episode occurred earlier this year he has had 2 episodes now in the past 2 weeks.  Yesterday he just consumed fried chicken and was driving home when he developed central chest discomfort.  He reports that this occurred around noon and lasted for 1 to 2 hours before spontaneously resolving.  Patient does not recall the specifics involving onset of his last two episodes of chest discomfort.  He denies any family history of premature cardiac disease.  He does smoke tobacco and is hoping to quit soon.  He denies any recent illness, fevers or chills, cough, difficulty breathing, abdominal discomfort, nausea or vomiting, diaphoresis, or other symptoms.  He denies any history of exertional syncope or exertional chest pain.  He was seen in urgent care about 10 days ago and they prescribed him famotidine which he states has not helped.  He has not tried any abortive methods and does note that water will make his "burning" sensation dissipate.  He also is unsure as to whether or not there is an anxiety component.  HPI     Past Medical History:  Diagnosis Date  . Hypertension     Patient Active Problem List   Diagnosis Date Noted  . Abscess of left foot 09/25/2013    History reviewed. No pertinent surgical history.     No family history on file.  Social History   Tobacco Use  . Smoking status: Current Every Day Smoker  Substance Use Topics  . Alcohol use: Yes  . Drug use: No    Home  Medications Prior to Admission medications   Medication Sig Start Date End Date Taking? Authorizing Provider  cephALEXin (KEFLEX) 500 MG capsule Take 1 capsule (500 mg total) by mouth 4 (four) times daily. 09/17/13   Dione Booze, MD  clindamycin (CLEOCIN) 300 MG capsule Take 1 capsule (300 mg total) by mouth 3 (three) times daily. 10/02/13   Vickki Hearing, MD  HYDROcodone-acetaminophen (NORCO) 5-325 MG per tablet Take 1-2 tablets by mouth every 4 (four) hours as needed. 09/24/13   Blane Ohara, MD  oxyCODONE (ROXICODONE) 5 MG immediate release tablet Take 1 tablet (5 mg total) by mouth every 4 (four) hours as needed for severe pain. 09/19/13   Azalia Bilis, MD  oxyCODONE-acetaminophen (PERCOCET) 5-325 MG per tablet Take 1 tablet by mouth every 4 (four) hours as needed for moderate pain. 09/17/13   Dione Booze, MD  sucralfate (CARAFATE) 1 g tablet Take 1 tablet (1 g total) by mouth 3 (three) times daily as needed (chest discomfort / acid reflux). 10/26/19   Lorelee New, PA-C  sulfamethoxazole-trimethoprim (SEPTRA DS) 800-160 MG per tablet Take 1 tablet by mouth 2 (two) times daily. 09/19/13   Azalia Bilis, MD    Allergies    Shellfish allergy  Review of Systems   Review of Systems  All other systems reviewed and are negative.   Physical Exam Updated Vital Signs BP Marland Kitchen)  137/80 (BP Location: Left Arm)   Pulse 67   Temp 98.2 F (36.8 C) (Oral)   Resp 18   Ht 5\' 8"  (1.727 m)   Wt (!) 97.5 kg   SpO2 99%   BMI 32.69 kg/m   Physical Exam Vitals and nursing note reviewed. Exam conducted with a chaperone present.  Constitutional:      General: He is not in acute distress.    Appearance: Normal appearance. He is not ill-appearing.  HENT:     Head: Normocephalic and atraumatic.  Eyes:     General: No scleral icterus.    Conjunctiva/sclera: Conjunctivae normal.  Cardiovascular:     Rate and Rhythm: Normal rate and regular rhythm.     Pulses: Normal pulses.     Heart sounds:  Normal heart sounds.  Pulmonary:     Effort: Pulmonary effort is normal. No respiratory distress.     Breath sounds: Normal breath sounds. No stridor. No wheezing, rhonchi or rales.  Chest:     Chest wall: No tenderness.  Musculoskeletal:     Right lower leg: No edema.     Left lower leg: No edema.  Skin:    General: Skin is dry.     Capillary Refill: Capillary refill takes less than 2 seconds.  Neurological:     Mental Status: He is alert and oriented to person, place, and time.     GCS: GCS eye subscore is 4. GCS verbal subscore is 5. GCS motor subscore is 6.  Psychiatric:        Mood and Affect: Mood normal.        Behavior: Behavior normal.        Thought Content: Thought content normal.     ED Results / Procedures / Treatments   Labs (all labs ordered are listed, but only abnormal results are displayed) Labs Reviewed  BASIC METABOLIC PANEL  CBC  TROPONIN I (HIGH SENSITIVITY)    EKG None  Radiology DG Chest 2 View  Result Date: 10/26/2019 CLINICAL DATA:  Patient reported chest pain that started in the center of his chest and moved to the left rib cage area and down his left arm and up into his jaw. Patient reported intermittent chest pains since the beginning of this year. Pt stated he was also diagnosed with resting hypertension. EXAM: CHEST - 2 VIEW COMPARISON:  03/28/2019 FINDINGS: The heart size and mediastinal contours are within normal limits. Both lungs are clear. No pleural effusion or pneumothorax. The visualized skeletal structures are unremarkable. IMPRESSION: No active cardiopulmonary disease. Electronically Signed   By: 05/26/2019 M.D.   On: 10/26/2019 10:11    Procedures Procedures (including critical care time)  Medications Ordered in ED Medications  sodium chloride flush (NS) 0.9 % injection 3 mL (has no administration in time range)    ED Course  I have reviewed the triage vital signs and the nursing notes.  Pertinent labs & imaging results  that were available during my care of the patient were reviewed by me and considered in my medical decision making (see chart for details).    MDM Rules/Calculators/A&P                          Patient is presenting for chest pain.  Comprehensive work-up obtained to assess for cause of symptoms.  EKG with ST elevation more suggestive of early repolarization.  Troponin is negative, lowering concern for NSTEMI.  Patient has a  low Heart Score which lowers suspicion for ACS.  I have low suspicion for dissection given normal pulses in extremities and normal mediastinum on CXR.  I reviewed DG Chest and there is no evidence of pneumothorax or consolidation concerning for pneumonia.  There is also no pleural effusion on x-ray or history suggestive of possible esophageal rupture.  Patient is PERC negative, low risk for PE per Wells Criteria.  I discussed the patient the exact etiology of their chest pain is unclear and warrants follow up with a primary care provider. Also discussed that while their risk for ACS is low, it is not completely eliminated and I discussed with patient specific warning signs and return precautions.  Given that yesterday's episode of chest pain occurred while at rest subsequent to eating fried chicken, suspect that there could be an element of reflux disease.  He is already taking famotidine, but he does not take any abortive agents.  Will prescribe Carafate to take the next time he experiences similar symptoms.  I informed him that resolution of symptoms would be diagnostic.  Do not feel as though PPI administration is warranted at this time given the infrequency of his episodes.  Patient will follow up with his primary care provider regarding today's encounter for ongoing evaluation management.  All of the evaluation and work-up results were discussed with the patient and any family at bedside.  Patient and/or family were informed that while patient is appropriate for discharge at this  time, some medical emergencies may only develop or become detectable after a period of time.  I specifically instructed patient and/or family to return to return to the ED or seek immediate medical attention for any new or worsening symptoms.  They were provided opportunity to ask any additional questions and have none at this time.  Prior to discharge patient is feeling well, agreeable with plan for discharge home.  They have expressed understanding of verbal discharge instructions as well as return precautions and are agreeable to the plan.   The patient was counseled on the dangers of tobacco use, and was advised to quit.  Reviewed strategies to maximize success, including removing cigarettes and smoking materials from environment, stress management, substitution of other forms of reinforcement, support of family/friends and written materials. Total time was 5 min CPT code 33825.    Final Clinical Impression(s) / ED Diagnoses Final diagnoses:  Nonspecific chest pain    Rx / DC Orders ED Discharge Orders         Ordered    sucralfate (CARAFATE) 1 g tablet  3 times daily PRN     Discontinue  Reprint     10/26/19 1715           Lorelee New, PA-C 10/26/19 1724    Vanetta Mulders, MD 11/03/19 1836

## 2021-05-30 IMAGING — CR DG CHEST 2V
2 series · 2 of 2 positions shown · non-contrast
Comparison: 03/28/2019

CLINICAL DATA: Patient reported chest pain that started in the
center of his chest and moved to the left rib cage area and down his
left arm and up into his jaw. Patient reported intermittent chest
pains since the beginning of this year. Pt stated he was also
diagnosed with resting hypertension.

EXAM:
CHEST - 2 VIEW

[w chest pa]
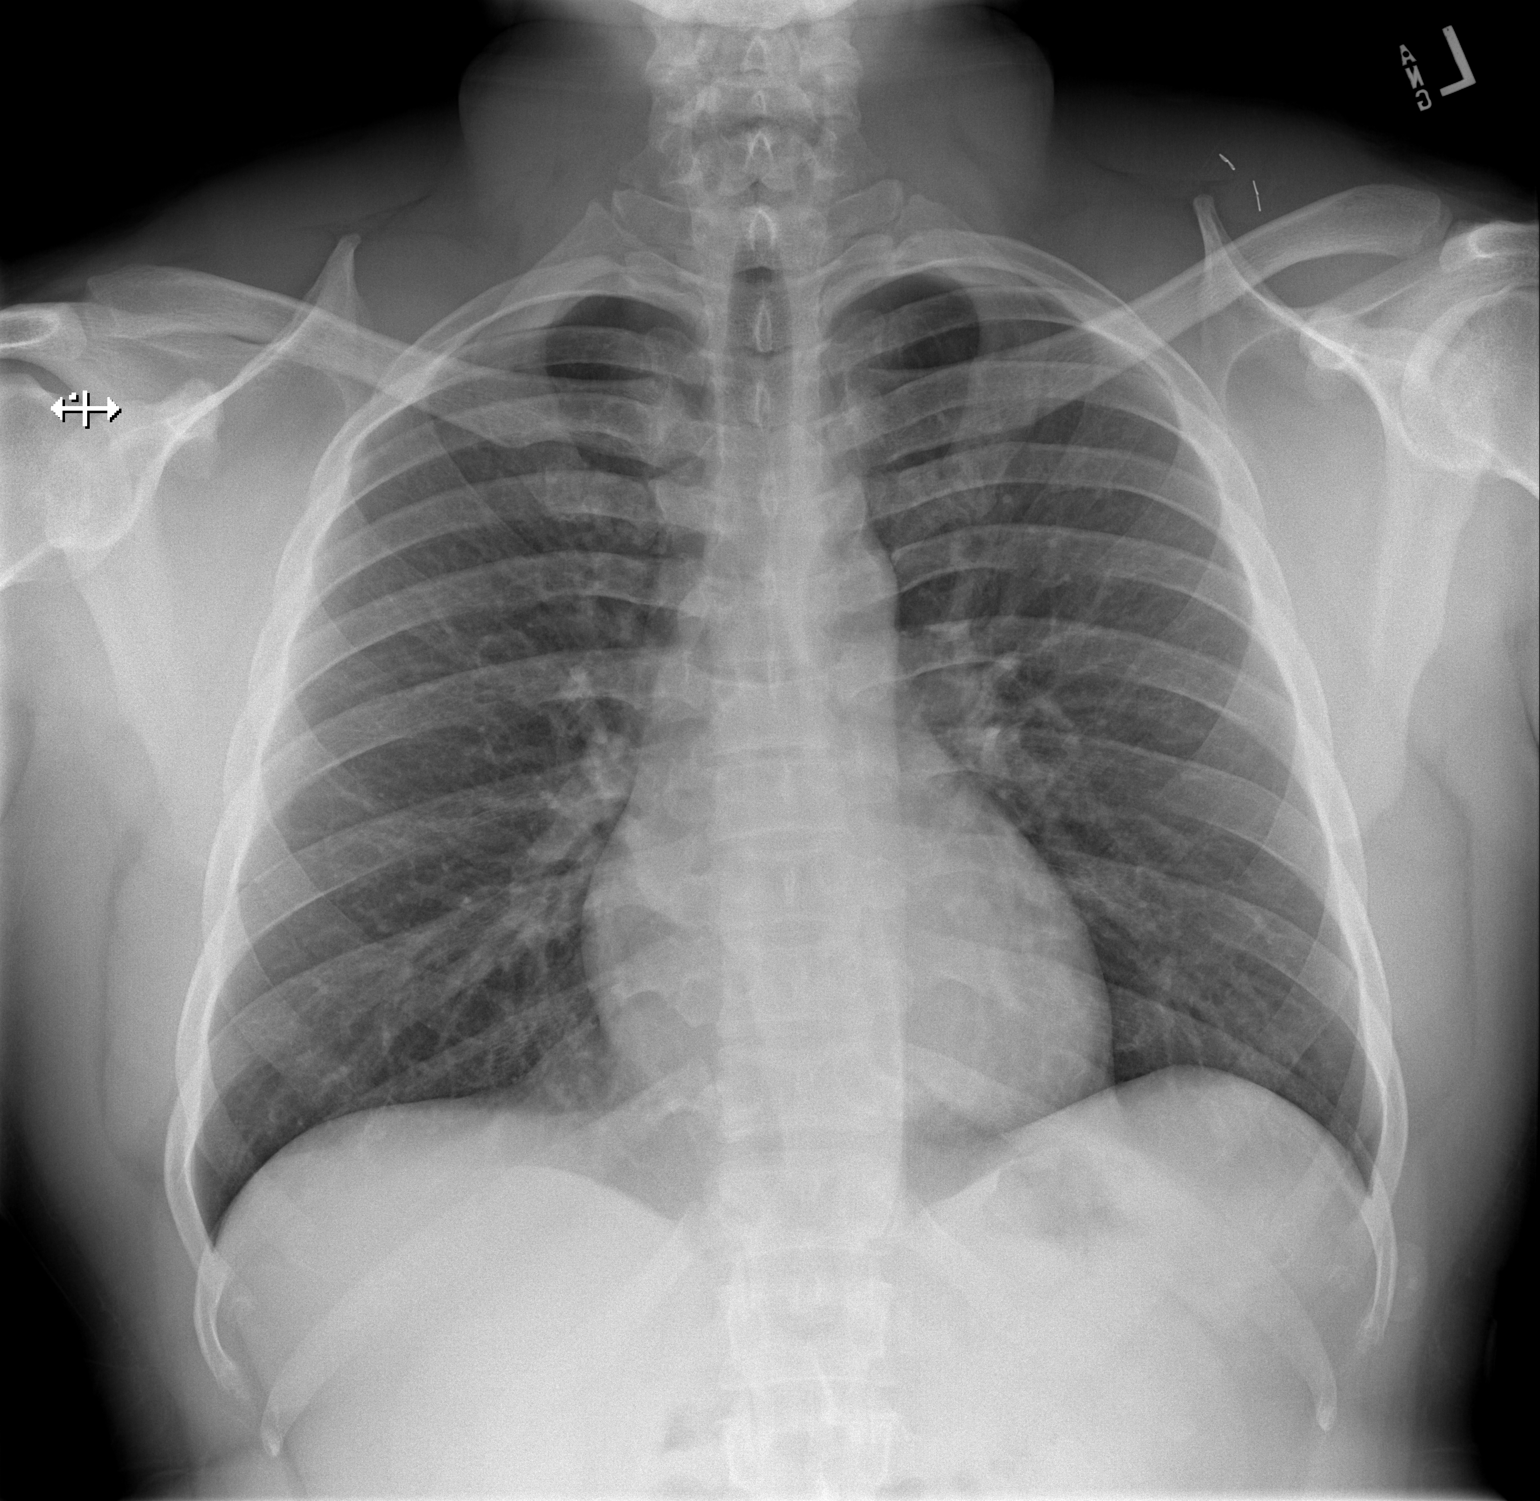

[w chest lat]
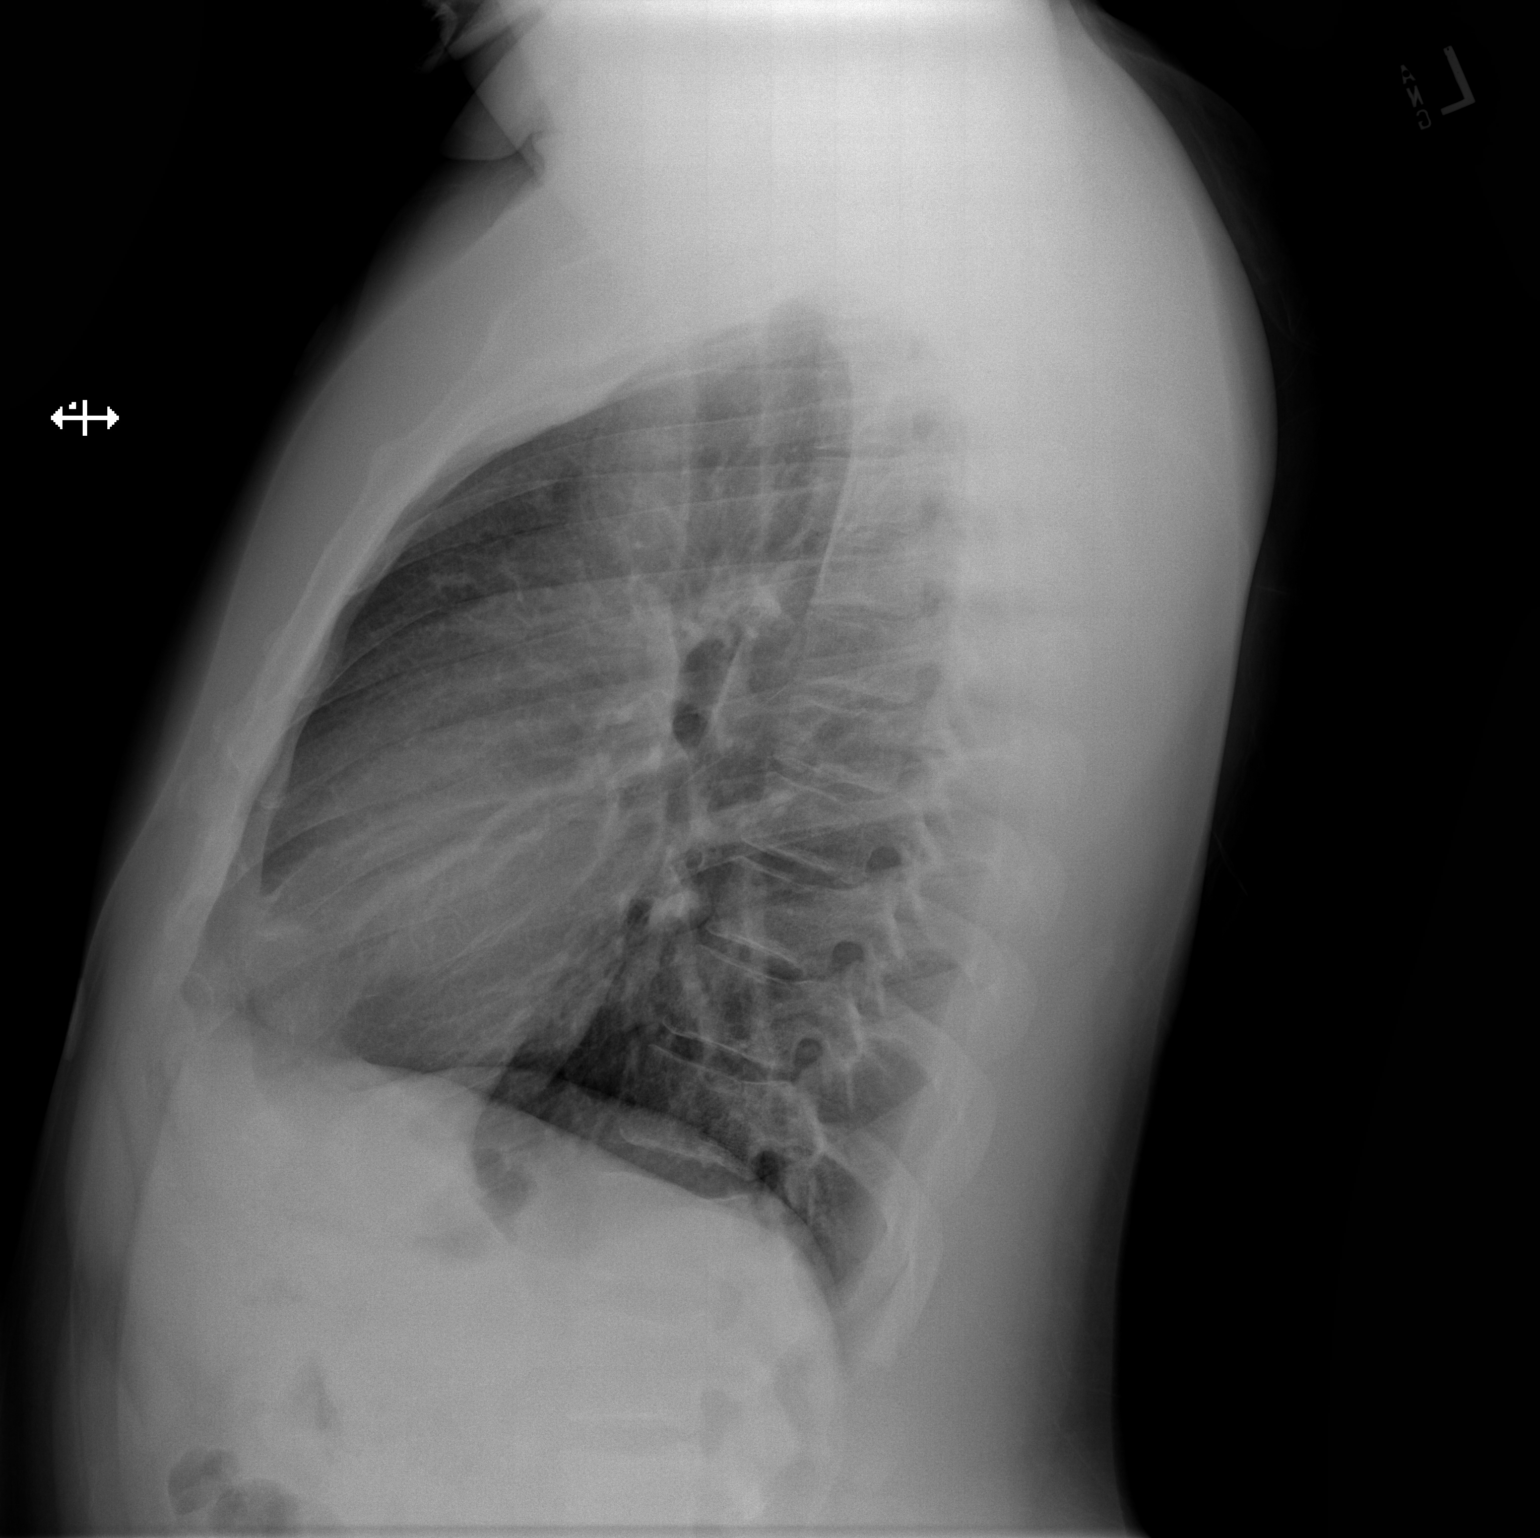

[2 of 2 positions shown; findings below may reference images not displayed]

FINDINGS: The heart size and mediastinal contours are within normal limits.
Both lungs are clear. No pleural effusion or pneumothorax. The
visualized skeletal structures are unremarkable.
IMPRESSION: No active cardiopulmonary disease.
